# Patient Record
Sex: Female | Born: 1960 | Race: Black or African American | Hispanic: No | Marital: Married | Smoking: Never smoker
Health system: Southern US, Community
[De-identification: ages and names within clinical notes are randomized; demographics above are authoritative.]

---

## 2004-06-24 ENCOUNTER — Other Ambulatory Visit: Payer: Self-pay

## 2004-06-24 ENCOUNTER — Emergency Department: Payer: Self-pay | Admitting: Internal Medicine

## 2006-01-05 IMAGING — CT CT HEAD WITHOUT CONTRAST
2 series · 16 of 30 positions shown, 20 images · non-contrast
Comparison: none

REASON FOR EXAM: ha/dizzy//room   5
COMMENTS:

[Series 2: without · axial · non-contrast · 0.39mm/px · z∈[-248,-133]mm · 13 of 27 slices shown, 17 images]
[im 2/27  brain]
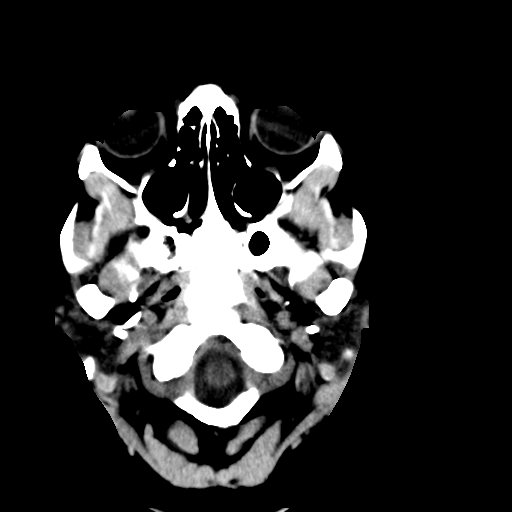
[im 2/27  bone]
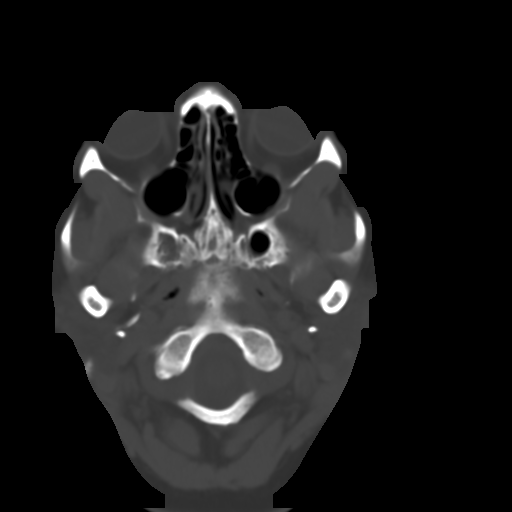
[im 4/27  brain]
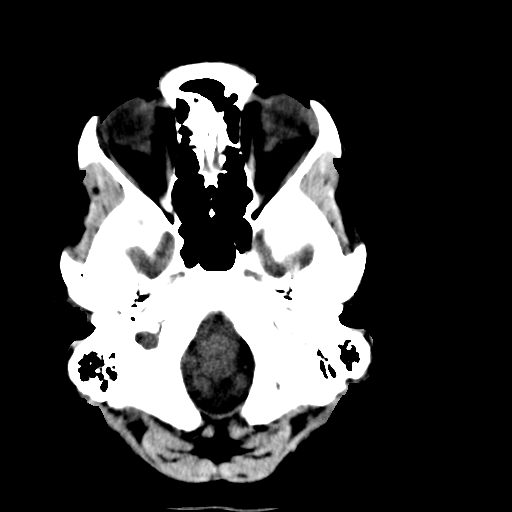
[im 6/27  brain]
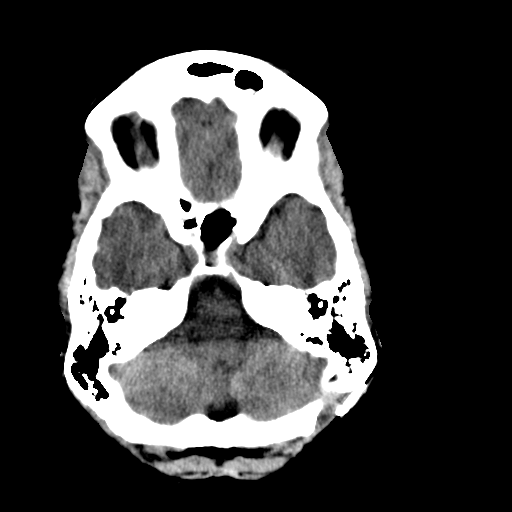
[im 8/27  brain]
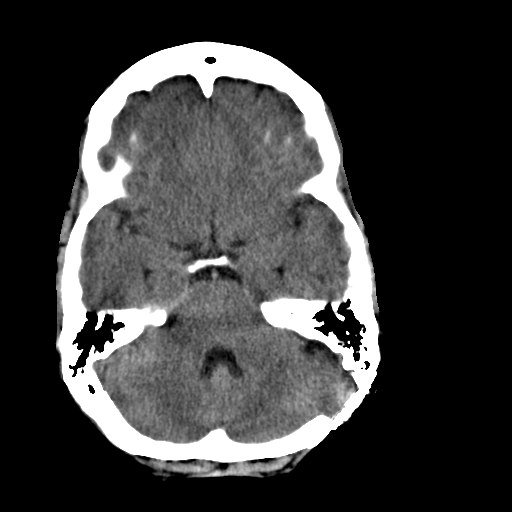
[im 10/27  brain]
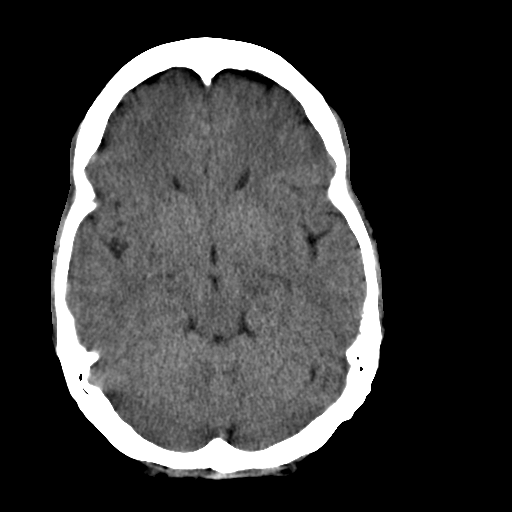
[im 10/27  bone]
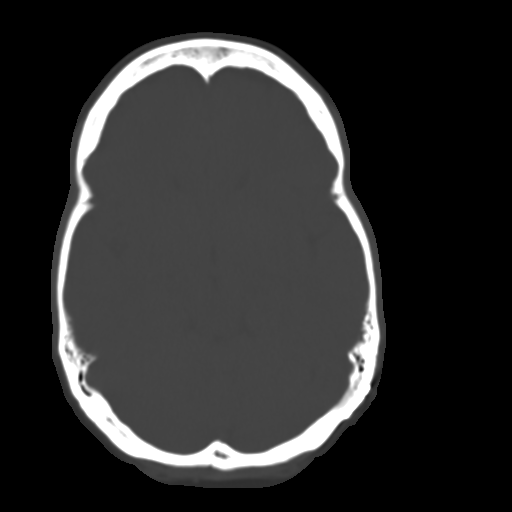
[im 12/27  brain]
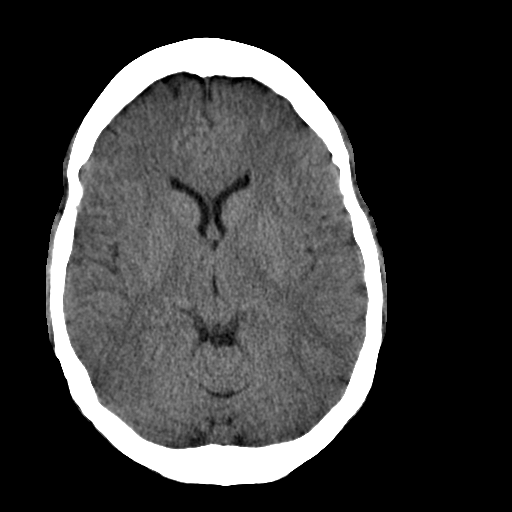
[im 14/27  brain]
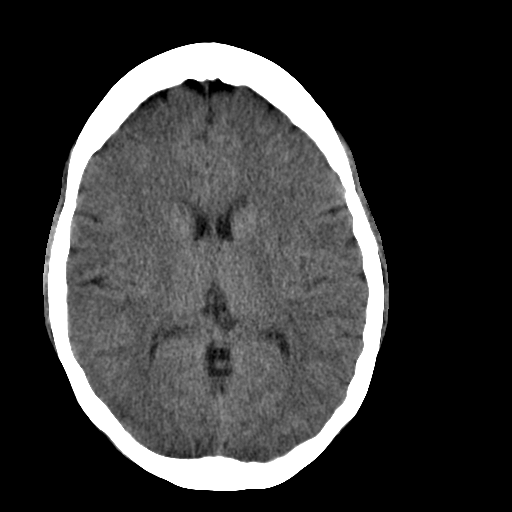
[im 15/27  brain]
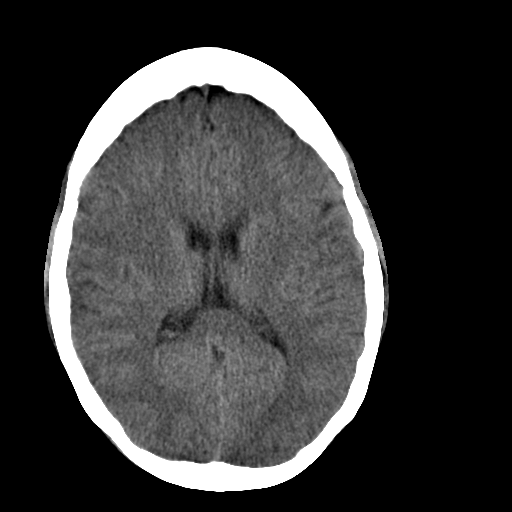
[im 17/27  brain]
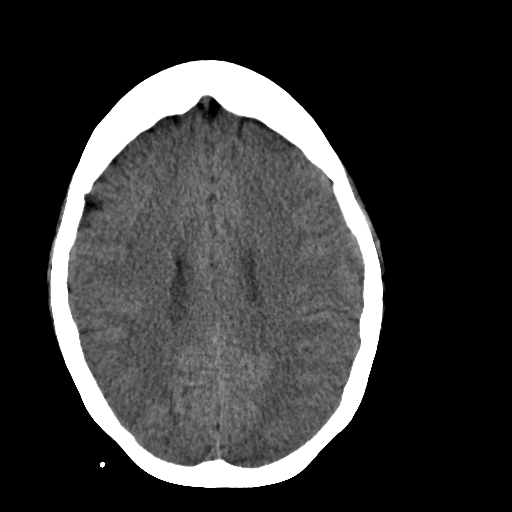
[im 17/27  bone]
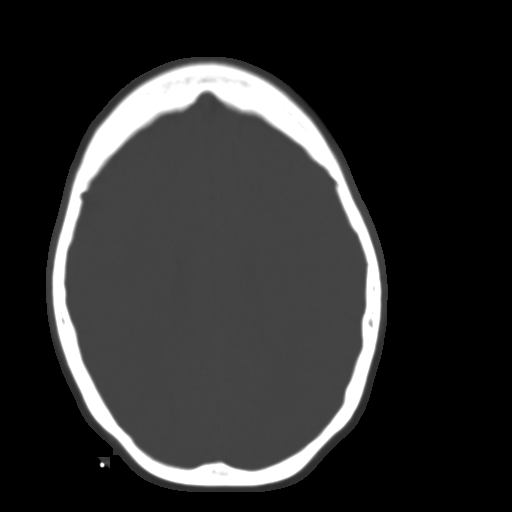
[im 19/27  brain]
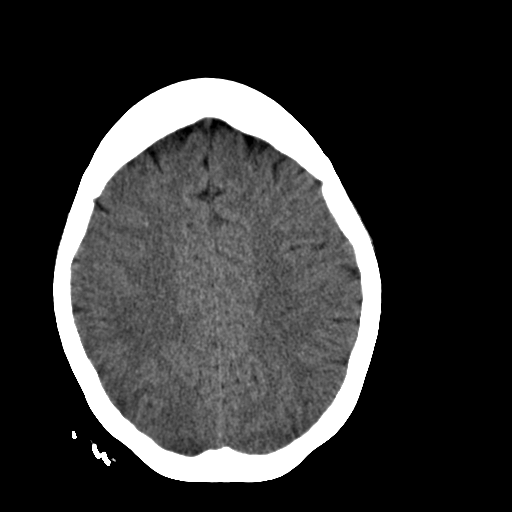
[im 21/27  brain]
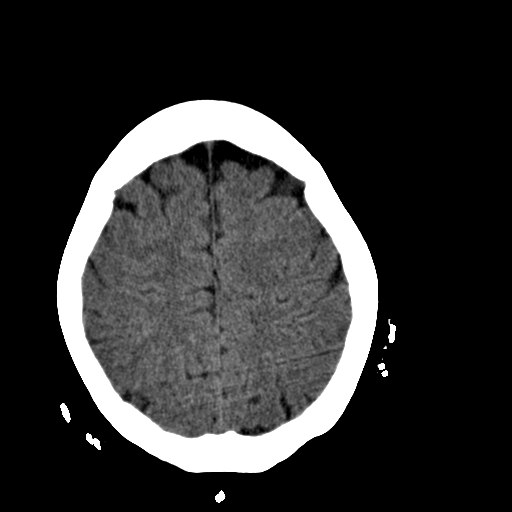
[im 23/27  brain]
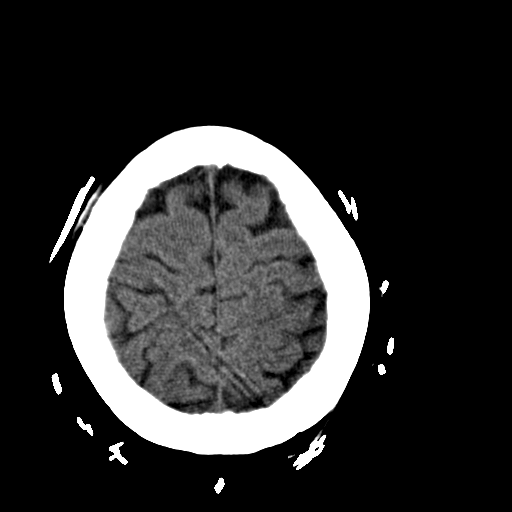
[im 25/27  brain]
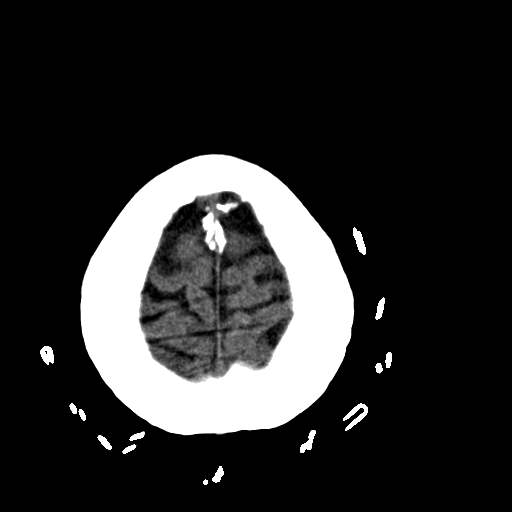
[im 25/27  bone]
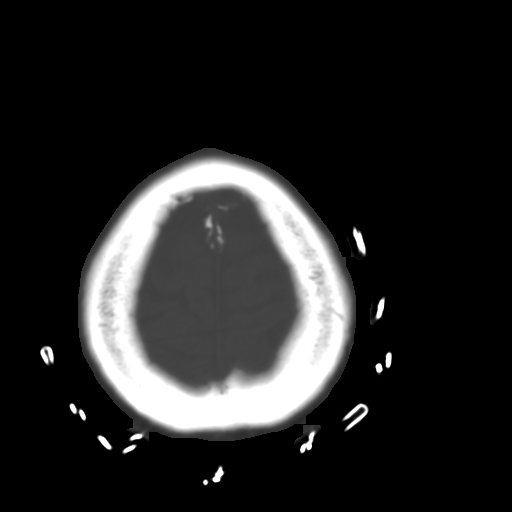

[Series 3: bone windows · axial · 0.39mm/px · z∈[-248,-208]mm · 3 of 27 slices shown]
[im 2/27  bone]
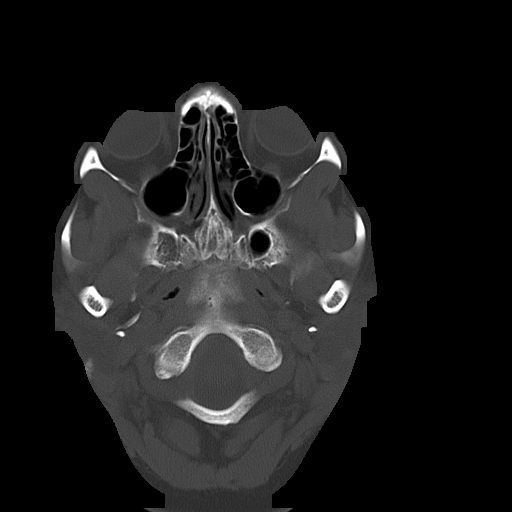
[im 6/27  bone]
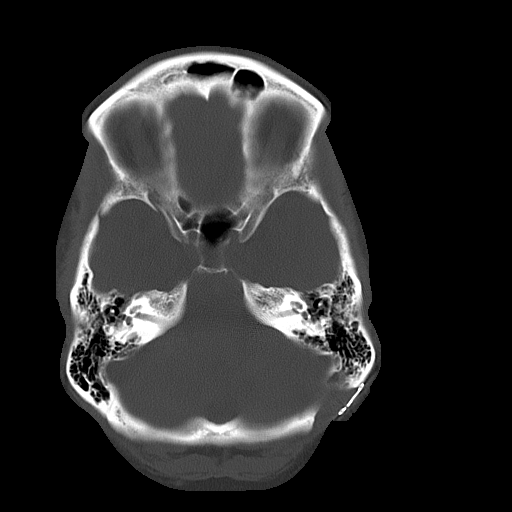
[im 10/27  bone]
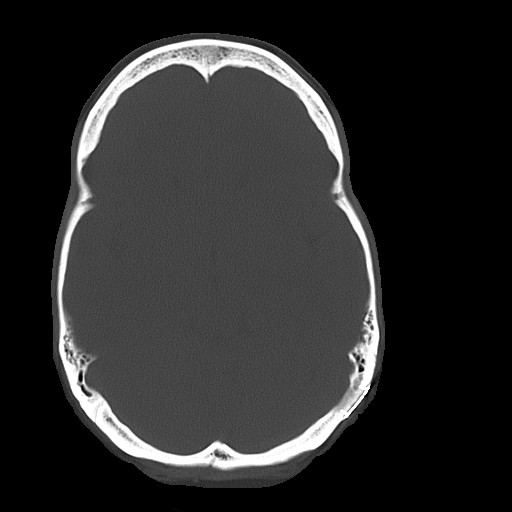

[16 of 30 positions shown; findings below may reference images not displayed]

PROCEDURE:     CT  - CT HEAD WITHOUT CONTRAST  - June 24, 2004  [DATE]

RESULT:     The patient sustained recent head trauma.

The ventricles are normal in size and position.  I see no evidence of a mass
nor of mass effect.  There is no evidence of an intracranial hemorrhage.
There is a small amount of soft tissue density material in the LEFT
maxillary sinus as well as in the posterior RIGHT ethmoid sinus cell.  The
sphenoid and frontal sinuses appear clear.  I do not see evidence of an
acute skull fracture.
IMPRESSION: 1.     I see no acute intracranial abnormality.
2.     The findings were called to the Emergency Room at the conclusion of
the study.

## 2007-07-27 ENCOUNTER — Ambulatory Visit: Payer: Self-pay | Admitting: Emergency Medicine

## 2009-05-07 ENCOUNTER — Ambulatory Visit: Payer: Self-pay | Admitting: Internal Medicine

## 2010-04-20 ENCOUNTER — Ambulatory Visit: Payer: Self-pay | Admitting: Internal Medicine

## 2010-09-21 ENCOUNTER — Ambulatory Visit: Payer: Self-pay | Admitting: Internal Medicine

## 2011-09-27 ENCOUNTER — Ambulatory Visit: Payer: Self-pay | Admitting: Internal Medicine

## 2012-06-10 ENCOUNTER — Ambulatory Visit: Payer: Self-pay | Admitting: Family Medicine

## 2013-08-17 ENCOUNTER — Ambulatory Visit: Payer: Self-pay | Admitting: Family Medicine

## 2013-08-17 LAB — RAPID INFLUENZA A&B ANTIGENS

## 2015-06-06 ENCOUNTER — Ambulatory Visit
Admission: EM | Admit: 2015-06-06 | Discharge: 2015-06-06 | Disposition: A | Discharge status: Home / Self Care | Payer: Federal, State, Local not specified - PPO | Attending: Family Medicine | Admitting: Family Medicine

## 2015-06-06 ENCOUNTER — Encounter: Payer: Self-pay | Admitting: Emergency Medicine

## 2015-06-06 DIAGNOSIS — B349 Viral infection, unspecified: Secondary | ICD-10-CM

## 2015-06-06 LAB — URINALYSIS COMPLETE WITH MICROSCOPIC (ARMC ONLY)
Bacteria, UA: NONE SEEN
GLUCOSE, UA: NEGATIVE mg/dL
Hgb urine dipstick: NEGATIVE
Leukocytes, UA: NEGATIVE
Nitrite: NEGATIVE
PH: 7 (ref 5.0–8.0)
RBC / HPF: NONE SEEN RBC/hpf (ref 0–5)
Specific Gravity, Urine: 1.02 (ref 1.005–1.030)

## 2015-06-06 LAB — RAPID STREP SCREEN (MED CTR MEBANE ONLY): Streptococcus, Group A Screen (Direct): NEGATIVE

## 2015-06-06 MED ORDER — HYDROCOD POLST-CPM POLST ER 10-8 MG/5ML PO SUER: 5.0000 mL | Freq: Two times a day (BID) | ORAL | Status: DC | PRN

## 2015-06-06 MED ORDER — IPRATROPIUM-ALBUTEROL 0.5-2.5 (3) MG/3ML IN SOLN
3.0000 mL | Freq: Once | RESPIRATORY_TRACT | Status: AC
  Administered 2015-06-06: 3 mL via RESPIRATORY_TRACT

## 2015-06-06 MED ORDER — ALBUTEROL SULFATE HFA 108 (90 BASE) MCG/ACT IN AERS: 1.0000 | INHALATION_SPRAY | Freq: Four times a day (QID) | RESPIRATORY_TRACT | Status: DC | PRN

## 2015-06-06 MED ORDER — BENZONATATE 100 MG PO CAPS: 100.0000 mg | ORAL_CAPSULE | Freq: Three times a day (TID) | ORAL | Status: DC | PRN

## 2015-06-06 MED ORDER — ONDANSETRON HCL 4 MG PO TABS: 4.0000 mg | ORAL_TABLET | Freq: Four times a day (QID) | ORAL | Status: DC

## 2015-06-06 MED ORDER — ONDANSETRON 8 MG PO TBDP
8.0000 mg | ORAL_TABLET | Freq: Once | ORAL | Status: AC
  Administered 2015-06-06: 8 mg via ORAL

## 2015-06-06 NOTE — Discharge Instructions (Signed)
Fluids more important than food... Void every 2-3 hours Sips and bites --not big meals until tummy tolerates BRAT  Bananas  Rice applesauce toast Zofran  8 mg up to twice a day for nausea  Viral illness can take 7-10 days to resolve---time and tylenol Rest..... Cough =Benzonatate /Tessalon up to 3 x day as needed  Robitussin DM ( Guaifenesin DM ) 1 teaspoon every 4 hours to thin mucous   Food Choices to Help Relieve Diarrhea, Adult When you have diarrhea, the foods you eat and your eating habits are very important. Choosing the right foods and drinks can help relieve diarrhea. Also, because diarrhea can last up to 7 days, you need to replace lost fluids and electrolytes (such as sodium, potassium, and chloride) in order to help prevent dehydration.  WHAT GENERAL GUIDELINES DO I NEED TO FOLLOW?  Slowly drink 1 cup (8 oz) of fluid for each episode of diarrhea. If you are getting enough fluid, your urine will be clear or pale yellow.  Eat starchy foods. Some good choices include white rice, white toast, pasta, low-fiber cereal, baked potatoes (without the skin), saltine crackers, and bagels.  Avoid large servings of any cooked vegetables.  Limit fruit to two servings per day. A serving is  cup or 1 small piece.  Choose foods with less than 2 g of fiber per serving.  Limit fats to less than 8 tsp (38 g) per day.  Avoid fried foods.  Eat foods that have probiotics in them. Probiotics can be found in certain dairy products.  Avoid foods and beverages that may increase the speed at which food moves through the stomach and intestines (gastrointestinal tract). Things to avoid include:  High-fiber foods, such as dried fruit, raw fruits and vegetables, nuts, seeds, and whole grain foods.  Spicy foods and high-fat foods.  Foods and beverages sweetened with high-fructose corn syrup, honey, or sugar alcohols such as xylitol, sorbitol, and mannitol. WHAT FOODS ARE  RECOMMENDED? Grains White rice. White, [place], or pita breads (fresh or toasted), including plain rolls, buns, or bagels. White pasta. Saltine, soda, or graham crackers. Pretzels. Low-fiber cereal. Cooked cereals made with water (such as cornmeal, farina, or cream cereals). Plain muffins. Matzo. Melba toast. Zwieback.  Vegetables Potatoes (without the skin). Strained tomato and vegetable juices. Most well-cooked and canned vegetables without seeds. Tender lettuce. Fruits Cooked or canned applesauce, apricots, cherries, fruit cocktail, grapefruit, peaches, pears, or plums. Fresh bananas, apples without skin, cherries, grapes, cantaloupe, grapefruit, peaches, oranges, or plums.  Meat and Other Protein Products Baked or boiled chicken. Eggs. Tofu. Fish. Seafood. Smooth peanut butter. Ground or well-cooked tender beef, ham, veal, lamb, pork, or poultry.  Dairy Plain yogurt, kefir, and unsweetened liquid yogurt. Lactose-free milk, buttermilk, or soy milk. Plain hard cheese. Beverages Sport drinks. Clear broths. Diluted fruit juices (except prune). Regular, caffeine-free sodas such as ginger ale. Water. Decaffeinated teas. Oral rehydration solutions. Sugar-free beverages not sweetened with sugar alcohols. Other Bouillon, broth, or soups made from recommended foods.  The items listed above may not be a complete list of recommended foods or beverages. Contact your dietitian for more options. WHAT FOODS ARE NOT RECOMMENDED? Grains Whole grain, whole wheat, bran, or rye breads, rolls, pastas, crackers, and cereals. Wild or brown rice. Cereals that contain more than 2 g of fiber per serving. Corn tortillas or taco shells. Cooked or dry oatmeal. Granola. Popcorn. Vegetables Raw vegetables. Cabbage, broccoli, Brussels sprouts, artichokes, baked beans, beet greens, corn, kale, legumes, peas, sweet potatoes,  and yams. Potato skins. Cooked spinach and cabbage. Fruits Dried fruit, including raisins and dates.  Raw fruits. Stewed or dried prunes. Fresh apples with skin, apricots, mangoes, pears, raspberries, and strawberries.  Meat and Other Protein Products Chunky peanut butter. Nuts and seeds. Beans and lentils. Tomasa BlaseBacon.  Dairy High-fat cheeses. Milk, chocolate milk, and beverages made with milk, such as milk shakes. Cream. Ice cream. Sweets and Desserts Sweet rolls, doughnuts, and sweet breads. Pancakes and waffles. Fats and Oils Butter. Cream sauces. Margarine. Salad oils. Plain salad dressings. Olives. Avocados.  Beverages Caffeinated beverages (such as coffee, tea, soda, or energy drinks). Alcoholic beverages. Fruit juices with pulp. Prune juice. Soft drinks sweetened with high-fructose corn syrup or sugar alcohols. Other Coconut. Hot sauce. Chili powder. Mayonnaise. Gravy. Cream-based or milk-based soups.  The items listed above may not be a complete list of foods and beverages to avoid. Contact your dietitian for more information. WHAT SHOULD I DO IF I BECOME DEHYDRATED? Diarrhea can sometimes lead to dehydration. Signs of dehydration include dark urine and dry mouth and skin. If you think you are dehydrated, you should rehydrate with an oral rehydration solution. These solutions can be purchased at pharmacies, retail stores, or online.  Drink -1 cup (120-240 mL) of oral rehydration solution each time you have an episode of diarrhea. If drinking this amount makes your diarrhea worse, try drinking smaller amounts more often. For example, drink 1-3 tsp (5-15 mL) every 5-10 minutes.  A general rule for staying hydrated is to drink 1-2 L of fluid per day. Talk to your health care provider about the specific amount you should be drinking each day. Drink enough fluids to keep your urine clear or pale yellow.   This information is not intended to replace advice given to you by your health care provider. Make sure you discuss any questions you have with your health care provider.   Document Released:  09/14/2003 Document Revised: 07/15/2014 Document Reviewed: 05/17/2013 Elsevier Interactive Patient Education 2016 Elsevier Inc.   Gastritis, Adult Gastritis is soreness and swelling (inflammation) of the lining of the stomach. Gastritis can develop as a sudden onset (acute) or long-term (chronic) condition. If gastritis is not treated, it can lead to stomach bleeding and ulcers. CAUSES  Gastritis occurs when the stomach lining is weak or damaged. Digestive juices from the stomach then inflame the weakened stomach lining. The stomach lining may be weak or damaged due to viral or bacterial infections. One common bacterial infection is the Helicobacter pylori infection. Gastritis can also result from excessive alcohol consumption, taking certain medicines, or having too much acid in the stomach.  SYMPTOMS  In some cases, there are no symptoms. When symptoms are present, they may include:  Pain or a burning sensation in the upper abdomen.  Nausea.  Vomiting.  An uncomfortable feeling of fullness after eating. DIAGNOSIS  Your caregiver may suspect you have gastritis based on your symptoms and a physical exam. To determine the cause of your gastritis, your caregiver may perform the following:  Blood or stool tests to check for the H pylori bacterium.  Gastroscopy. A thin, flexible tube (endoscope) is passed down the esophagus and into the stomach. The endoscope has a light and camera on the end. Your caregiver uses the endoscope to view the inside of the stomach.  Taking a tissue sample (biopsy) from the stomach to examine under a microscope. TREATMENT  Depending on the cause of your gastritis, medicines may be prescribed. If you have a bacterial  infection, such as an H pylori infection, antibiotics may be given. If your gastritis is caused by too much acid in the stomach, H2 blockers or antacids may be given. Your caregiver may recommend that you stop taking aspirin, ibuprofen, or other  nonsteroidal anti-inflammatory drugs (NSAIDs). HOME CARE INSTRUCTIONS  Only take over-the-counter or prescription medicines as directed by your caregiver.  If you were given antibiotic medicines, take them as directed. Finish them even if you start to feel better.  Drink enough fluids to keep your urine clear or pale yellow.  Avoid foods and drinks that make your symptoms worse, such as:  Caffeine or alcoholic drinks.  Chocolate.  Peppermint or mint flavorings.  Garlic and onions.  Spicy foods.  Citrus fruits, such as oranges, lemons, or limes.  Tomato-based foods such as sauce, chili, salsa, and pizza.  Fried and fatty foods.  Eat small, frequent meals instead of large meals. SEEK IMMEDIATE MEDICAL CARE IF:   You have black or dark red stools.  You vomit blood or material that looks like coffee grounds.  You are unable to keep fluids down.  Your abdominal pain gets worse.  You have a fever.  You do not feel better after 1 week.  You have any other questions or concerns. MAKE SURE YOU:  Understand these instructions.  Will watch your condition.  Will get help right away if you are not doing well or get worse.   This information is not intended to replace advice given to you by your health care provider. Make sure you discuss any questions you have with your health care provider.   Document Released: 06/18/2001 Document Revised: 12/24/2011 Document Reviewed: 08/07/2011 Elsevier Interactive Patient Education [employer]

## 2015-06-06 NOTE — ED Notes (Signed)
Patient c/o cough, chest congestion, and bodyaches for the past 4 days.  Patient denies fevers.

## 2015-06-07 ENCOUNTER — Encounter: Payer: Self-pay | Admitting: Physician Assistant

## 2015-06-07 NOTE — ED Provider Notes (Signed)
CSN: [id number]     Arrival date & time 06/06/15  1226 History   First MD Initiated Contact with Patient 06/06/15 1326     Chief Complaint  Patient presents with  . Cough   (Consider location/radiation/quality/duration/timing/severity/associated sxs/prior Treatment) HPI 54 yo F presents concerned about the flu. 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 Has had 5 days of nasal congestion, cough, and body aches.  Last 3 days developed nausea, a few episodes of vomiting, and after a few diarrhea she self medicated with multiple doses of Immodium and it has stopped. Denies food issue, unusual travel/restaurant/was with family for holidays and baby she held and kissed alot was sick. 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 No pertinent past medical history. History reviewed. No pertinent past surgical history. History reviewed. No pertinent family history. Social History  Substance Use Topics  . Smoking status: Never Smoker   . Smokeless tobacco: None  . Alcohol Use: No   OB History    No data available     Review of Systems Review of 10 systems negative for acute change except as referenced in HPI Unable to produce more than very small amount urine -trying to check for ketones- did much better with intake after Zofran.  Had no Urinary symptoms per se  Allergies  Review of patient's allergies indicates no known allergies.  Home Medications   Prior to Admission medications   Medication Sig Start Date End Date Taking? Authorizing Provider  amLODipine (NORVASC) 5 MG tablet Take 5 mg by mouth daily.   Yes Historical Provider, MD  ibuprofen (ADVIL,MOTRIN) 800 MG tablet Take 800 mg by mouth every 8 (eight) hours as needed.   Yes Historical Provider, MD  lisinopril (PRINIVIL,ZESTRIL) 10 MG tablet Take 10 mg by mouth daily.   Yes Historical Provider, MD  albuterol (PROVENTIL HFA;VENTOLIN HFA) 108 (90  BASE) MCG/ACT inhaler Inhale 1-2 puffs into the lungs every 6 (six) hours as needed for wheezing or shortness of breath. 06/06/15   Rae Halsted, PA-C  benzonatate (TESSALON) 100 MG capsule Take 1 capsule (100 mg total) by mouth 3 (three) times daily as needed. 06/06/15   Rae Halsted, PA-C  chlorpheniramine-HYDROcodone [facility] PENNKINETIC ER) 10-8 MG/5ML SUER Take 5 mLs by mouth every 12 (twelve) hours as needed for cough. 06/06/15   Rae Halsted, PA-C  ondansetron (ZOFRAN) 4 MG tablet Take 1 tablet (4 mg total) by mouth every 6 (six) hours. 06/06/15   Rae Halsted, PA-C   Meds Ordered and Administered this Visit   Medications  ondansetron (ZOFRAN-ODT) disintegrating tablet 8 mg (8 mg Oral Given 06/06/15 1405)  ipratropium-albuterol (DUONEB) 0.5-2.5 (3) MG/3ML nebulizer solution 3 mL (3 mLs Nebulization Given 06/06/15 1446)  marked improvement with resolution of nausea and then able to drink gingerale without difficulty After nausea resolved , duoNeb was well tolerated and cough improved  BP 144/81 mmHg  Pulse 66  Temp(Src) 97 F (36.1 C) (Tympanic)  Resp 16  Ht  (1.626 m)  Wt 170 lb (77.111 kg)  BMI 29.17 kg/m2  SpO2 100% No data found.   Physical Exam  General: NAD, does not appear toxic HEENT:normocephalic,atraumatic, mucous membranes moist, mild erythema ; STat Strep checked; grossly normal hearing Eyes: EOMI, conjunctiva clear, conjugate gaze Neck: supple,no lymphadenopathy Resp : CT A, bilat; normal respiratory effort,nonproductive  Intermittent cough Card : RRR Abd:  Not distended, soft ,  non tender, nomall BS Skin: no rash, skin intact MSK: no deformities, ambulatory without assistance Neuro : good attention,recall-good memory, no focal neuro deficits Psych: speech and behavior appropriate   ED Course  Procedures (including critical care time)  Labs Review Labs Reviewed  URINALYSIS COMPLETEWITH MICROSCOPIC ([facility] ONLY) - Abnormal; Notable for the  following:    Bilirubin Urine 1+ (*)    Ketones, ur TRACE (*)    Protein, ur TRACE (*)    Squamous Epithelial / LPF 0-5 (*)    All other components within normal limits  RAPID STREP SCREEN (NOT [facility])  CULTURE, GROUP A STREP ([facility] ONLY)    Imaging Review No results found.         MDM   1. Viral syndrome    Plan: Test results and diagnosis reviewed with patient/sister Rx as per orders;  benefits, risks, potential side effects reviewed   Recommend supportive treatment with cyclic tylenol and ibuprofen, fluids, bland diet advance as tolerated Seek additional medical care if symptoms worsen or are not improving  Discharge Medication List as of 06/06/2015  3:22 PM    START taking these medications   Details  albuterol (PROVENTIL HFA;VENTOLIN HFA) 108 (90 BASE) MCG/ACT inhaler Inhale 1-2 puffs into the lungs every 6 (six) hours as needed for wheezing or shortness of breath., Starting 06/06/2015, Until Discontinued, Print    benzonatate (TESSALON) 100 MG capsule Take 1 capsule (100 mg total) by mouth 3 (three) times daily as needed., Starting 06/06/2015, Until Discontinued, Print    chlorpheniramine-HYDROcodone (TUSSIONEX PENNKINETIC ER) 10-8 MG/5ML SUER Take 5 mLs by mouth every 12 (twelve) hours as needed for cough., Starting 06/06/2015, Until Discontinued, Print    ondansetron (ZOFRAN) 4 MG tablet Take 1 tablet (4 mg total) by mouth every 6 (six) hours., Starting 06/06/2015, Until Discontinued, Print          Rae HalstedLaurie W Donyell Carrell, PA-C 06/07/15 1348

## 2015-06-09 LAB — CULTURE, GROUP A STREP (THRC)

## 2016-10-18 ENCOUNTER — Ambulatory Visit
Admission: EM | Admit: 2016-10-18 | Discharge: 2016-10-18 | Disposition: A | Discharge status: Home / Self Care | Payer: Federal, State, Local not specified - PPO | Attending: Physician Assistant | Admitting: Physician Assistant

## 2016-10-18 DIAGNOSIS — R05 Cough: Secondary | ICD-10-CM

## 2016-10-18 DIAGNOSIS — B9789 Other viral agents as the cause of diseases classified elsewhere: Secondary | ICD-10-CM

## 2016-10-18 DIAGNOSIS — R059 Cough, unspecified: Secondary | ICD-10-CM

## 2016-10-18 DIAGNOSIS — J069 Acute upper respiratory infection, unspecified: Secondary | ICD-10-CM | POA: Diagnosis not present

## 2016-10-18 MED ORDER — OSELTAMIVIR PHOSPHATE 75 MG PO CAPS: 75.0000 mg | ORAL_CAPSULE | Freq: Two times a day (BID) | ORAL | 0 refills | Status: AC

## 2016-10-18 NOTE — Discharge Instructions (Signed)
-  Complete Tamiflu five day course -Can use over the counter cough medications, like Robitussin DM for cough control. -Tylenol or ibuprofen for pain and fevers -Drink plenty of fluids -Return to clinic or PCP should symptoms worsen or not improve.

## 2016-10-18 NOTE — ED Triage Notes (Signed)
Pt c/o hot and cold chills, coughing that is keeping her up at night. She has a sore throat.

## 2016-10-18 NOTE — ED Provider Notes (Signed)
[facility] ____________________________________________  Time seen: Approximately 8:40 AM  I have reviewed the triage vital signs and the nursing notes.   HISTORY  Chief Complaint Cough   HPI Jordan Rivera is a 56 y.o. female who presents with nonproductive cough, hot/cold chills, chest wall pain associated with frequent cough, fever at night for approximately 2 days. Patient fortunately Mucinex, Alka-Seltzer plus, and Coricidin HBP with minimal effectiveness. Patient reports symptoms have actually been worsening. Patient denied sick contact at work but does report she Her grandson within the last week. Her grandson's mother has had the flu and her grandson has also had a upper respiratory symptoms as well as an ear infection. Patient denies any shortness of breath no asthma, no COPD. Patient denies any chest pains other than the chest wall pain, no abdominal issues, no urinary symptoms.   History reviewed. No pertinent past medical history.  There are no active problems to display for this patient.   History reviewed. No pertinent surgical history.   No current facility-administered medications for this encounter.   Current Outpatient Prescriptions:  .  amLODipine (NORVASC) 5 MG tablet, Take 5 mg by mouth daily., Disp: , Rfl:  .  ibuprofen (ADVIL,MOTRIN) 800 MG tablet, Take 800 mg by mouth every 8 (eight) hours as needed., Disp: , Rfl:  .  lisinopril (PRINIVIL,ZESTRIL) 10 MG tablet, Take 10 mg by mouth daily., Disp: , Rfl:  .  oseltamivir (TAMIFLU) 75 MG capsule, Take 1 capsule (75 mg total) by mouth every 12 (twelve) hours., Disp: 10 capsule, Rfl: 0  Allergies Patient has no known allergies.  Family History  Problem Relation Age of Onset  . Hypertension Mother   . Stroke Mother   . Stroke Father     Social History Social History  Substance Use Topics  . Smoking status: Never Smoker  . Smokeless tobacco: Never Used  . Alcohol use No    Review  of Systems Constitutional: + chills, fever at night Eyes: No visual changes. ENT: No sore throat. Cardiovascular: Denies chest pain. Respiratory: Denies shortness of breath. + non productive cough, + chest wall pain for cough Gastrointestinal: No abdominal pain.  No nausea, no vomiting.  No diarrhea.  No constipation. Genitourinary: Negative for dysuria. Musculoskeletal: Negative for back pain. Skin: Negative for rash. Neurological: Negative for headaches, focal weakness or numbness.  10-point ROS otherwise negative.  ____________________________________________   PHYSICAL EXAM:  VITAL SIGNS: ED Triage Vitals  Enc Vitals Group     BP 10/18/16 0821 (!) 126/99     Pulse Rate 10/18/16 0821 89     Resp 10/18/16 0821 18     Temp 10/18/16 0821 98.4 F (36.9 C)     Temp Source 10/18/16 0821 Oral     SpO2 10/18/16 0821 97 %     Weight 10/18/16 0820 157 lb (71.2 kg)     Height 10/18/16 0820  (1.626 m)     Head Circumference --      Peak Flow --      Pain Score 10/18/16 0822 5     Pain Loc --      Pain Edu? --      Excl. in GC? --     Constitutional: Alert and oriented. Well-nourished well-developed Eyes: Conjunctivae are normal. PERRL. EOMI. ENT      Head: Normocephalic and atraumatic.      Nose: No congestion/rhinnorhea.      Mouth/Throat: Mucous membranes are moist.Oropharynx with mild erythema  Ears: Bilateral TMs clear, no fluid noted Neck: No stridor. Supple  Hematological/Lymphatic/Immunilogical: No cervical lymphadenopathy. Cardiovascular: Normal rate, regular rhythm. Grossly normal heart sounds.  Good peripheral circulation. Respiratory: Normal respiratory effort. Breath sounds are clear and equal bilaterally. Frequent nonproductive cough Gastrointestinal: Soft and nontender. No distention. Musculoskeletal:  Nontender with normal range of motion in all extremities.       Right lower leg:  No tenderness or edema.      Left lower leg:  No tenderness or  edema.  Neurologic:  Normal speech and language. No gross focal neurologic deficits are appreciated. Speech is normal. No gait instability.  Skin:  Skin is warm, dry and intact. No rash noted. Psychiatric: Mood and affect are normal. Speech and behavior are normal. Patient exhibits appropriate insight and judgment   ___________________________________________   LABS (all labs ordered are listed, but only abnormal results are displayed)  Labs Reviewed - No data to display ____________________________________________ RADIOLOGY  No results found. ____________________________________________   PROCEDURES Procedures   Procedure(s) performed: None  ___________________________________________   FINAL CLINICAL IMPRESSION(S) / ED DIAGNOSES  Final diagnoses:  Cough  Viral URI with cough     New Prescriptions   OSELTAMIVIR (TAMIFLU) 75 MG CAPSULE    Take 1 capsule (75 mg total) by mouth every 12 (twelve) hours.    Pt with signs of viral URI with non-productive cough, + chills, and fever with recent flu contact. Will prescribe course of Tamiflu and recommend Robitussin DM OTC cough medicine for cough relief, fluids, and supportive care. Ibuprofen or APAP for fevers and pains.Discussed follow up with Primary care physician this week. Discussed follow up and return parameters including no resolution or any worsening concerns. Patient verbalized understanding and agreed to plan.   Note: This dictation was prepared with Dragon dictation along with smaller phrase technology. Any transcriptional errors that result from this process are unintentional.   Candis Schatz, PA-C        Candis Schatz, PA-C 10/18/16 [phone]

## 2016-10-21 ENCOUNTER — Telehealth: Payer: Self-pay | Admitting: Emergency Medicine

## 2016-10-21 MED ORDER — BENZONATATE 100 MG PO CAPS: 100.0000 mg | ORAL_CAPSULE | Freq: Three times a day (TID) | ORAL | 0 refills | Status: AC

## 2017-07-11 NOTE — Telephone Encounter (Signed)
close

## 2018-07-23 ENCOUNTER — Ambulatory Visit: Payer: No Typology Code available for payment source | Attending: Neurology

## 2018-07-23 DIAGNOSIS — R0683 Snoring: Secondary | ICD-10-CM | POA: Insufficient documentation
